# Patient Record
Sex: Male | Born: 1960 | Race: White | Hispanic: No | Marital: Married | Smoking: Former smoker
Health system: Southern US, Community
[De-identification: ages and names within clinical notes are randomized; demographics above are authoritative.]

## PROBLEM LIST (undated history)

## (undated) HISTORY — PX: ANAL FISSURE REPAIR: SHX2312

---

## 2002-09-07 ENCOUNTER — Emergency Department (HOSPITAL_COMMUNITY): Admission: EM | Admit: 2002-09-07 | Discharge: 2002-09-07 | Payer: Self-pay | Admitting: Emergency Medicine

## 2011-06-30 ENCOUNTER — Encounter (HOSPITAL_COMMUNITY): Payer: Self-pay | Admitting: Emergency Medicine

## 2011-06-30 ENCOUNTER — Emergency Department (HOSPITAL_COMMUNITY)
Admission: EM | Admit: 2011-06-30 | Discharge: 2011-06-30 | Disposition: A | Discharge status: Home / Self Care | Payer: BC Managed Care – PPO | Source: Home / Self Care | Attending: Emergency Medicine | Admitting: Emergency Medicine

## 2011-06-30 DIAGNOSIS — B349 Viral infection, unspecified: Secondary | ICD-10-CM

## 2011-06-30 DIAGNOSIS — B9789 Other viral agents as the cause of diseases classified elsewhere: Secondary | ICD-10-CM

## 2011-06-30 MED ORDER — ALBUTEROL SULFATE HFA 108 (90 BASE) MCG/ACT IN AERS: 1.0000 | INHALATION_SPRAY | Freq: Four times a day (QID) | RESPIRATORY_TRACT | Status: DC | PRN

## 2011-06-30 MED ORDER — HYDROCODONE-ACETAMINOPHEN 7.5-500 MG/15ML PO SOLN: 5.0000 mL | Freq: Four times a day (QID) | ORAL | Status: AC | PRN

## 2011-06-30 MED ORDER — IBUPROFEN 800 MG PO TABS: 800.0000 mg | ORAL_TABLET | Freq: Three times a day (TID) | ORAL | Status: AC | PRN

## 2011-06-30 NOTE — ED Provider Notes (Signed)
History     CSN: [id number]  Arrival date & time 06/30/11  1007   First MD Initiated Contact with Patient 06/30/11 1022      Chief Complaint  Patient presents with  . URI    (Consider location/radiation/quality/duration/timing/severity/associated sxs/prior treatment) HPI Comments: Patient with malaise, body aches, mild sore throat, nonproductive cough for 3 days. States he had a fever approximately 100-101 the first day, but that this has resolved. Patient states he feels better, but complains of persistent, nonproductive cough. No nasal congestion, rhinorrhea, nausea, vomiting, wheezing, chest pain, shortness of breath, abdominal pain. Daughter currently has pink eye and an upper respiratory syndrome. Has been taking NyQuil and DayQuil without relief.  ROS as noted in HPI. All other ROS negative.   Patient is a 51 y.o. male presenting with URI. The history is provided by the patient. No language interpreter was used.  URI The primary symptoms include cough. The current episode started 3 to 5 days ago. This is a new problem.  The onset of the illness is associated with exposure to sick contacts. Symptoms associated with the illness include chills. The illness is not associated with facial pain, sinus pressure, congestion or rhinorrhea.    History reviewed. No pertinent past medical history.  History reviewed. No pertinent past surgical history.  History reviewed. No pertinent family history.  History  Substance Use Topics  . Smoking status: Never Smoker   . Smokeless tobacco: Not on file  . Alcohol Use: Yes      Review of Systems  Constitutional: Positive for chills.  HENT: Negative for congestion, rhinorrhea and sinus pressure.   Respiratory: Positive for cough.     Allergies  Review of patient's allergies indicates no known allergies.  Home Medications   Current Outpatient Rx  Name Route Sig Dispense Refill  . GLUCOSAMINE-CHONDROITIN 500-400 MG PO TABS Oral Take  1 tablet by mouth 3 (three) times daily.    . MULTIVITAMINS PO TABS Oral Take 1 tablet by mouth daily.    . NYQUIL PO Oral Take by mouth.    . DAYQUIL PO Oral Take by mouth.    . ALBUTEROL SULFATE HFA 108 (90 BASE) MCG/ACT IN AERS Inhalation Inhale 1-2 puffs into the lungs every 6 (six) hours as needed for wheezing. Dispense with aerochamber 1 Inhaler 0  . HYDROCODONE-ACETAMINOPHEN 7.5-500 MG/15ML PO SOLN Oral Take 5 mLs by mouth every 6 (six) hours as needed for pain. 120 mL 0  . IBUPROFEN 800 MG PO TABS Oral Take 1 tablet (800 mg total) by mouth every 8 (eight) hours as needed for pain. 30 tablet 0    BP 132/82  Pulse 80  Temp(Src) 99 F (37.2 C) (Oral)  Resp 16  SpO2 100%  Physical Exam  Nursing note and vitals reviewed. Constitutional: He is oriented to person, place, and time. He appears well-developed and well-nourished.  HENT:  Head: Normocephalic and atraumatic.  Right Ear: Hearing, tympanic membrane and ear canal normal.  Left Ear: Hearing, tympanic membrane and ear canal normal.  Nose: Nose normal.  Mouth/Throat: Uvula is midline and mucous membranes are normal. Posterior oropharyngeal erythema present. No oropharyngeal exudate.       No sinus tenderness. Erythematous oropharynx with tender ulcers.  Eyes: Conjunctivae and EOM are normal. Pupils are equal, round, and reactive to light.  Neck: Normal range of motion. Neck supple.  Cardiovascular: Normal rate, regular rhythm and normal heart sounds.   Pulmonary/Chest: Effort normal and breath sounds normal. No respiratory distress.  Abdominal: Soft. Bowel sounds are normal. He exhibits no distension.  Musculoskeletal: Normal range of motion. He exhibits no edema and no tenderness.  Lymphadenopathy:    He has no cervical adenopathy.  Neurological: He is alert and oriented to person, place, and time.  Skin: Skin is warm and dry. No rash noted.  Psychiatric: He has a normal mood and affect. His behavior is normal. Judgment and  thought content normal.    ED Course  Procedures (including critical care time)  Labs Reviewed - No data to display No results found.   1. Viral syndrome       MDM  Lungs clear, no evidence of pneumonia at this time. H&P most consistent with coxsackie viral infection, with cough.  Luiz Blare, MD 06/30/11 [phone]

## 2011-06-30 NOTE — Discharge Instructions (Signed)
Take the medication as written. Take 1 gram of tylenol with the motrin up to 3 times a day as needed for pain and fever. This is an effective combination. Drink extra fluids. 2 puffs from the albuterol every 4-6 hours as needed for cough. Return if you get worse, have a persistent fever >100.4, or for any concerns.   Go to www.goodrx.com to look up your medications. This will give you a list of where you can find your prescriptions at the most affordable prices.

## 2011-06-30 NOTE — ED Notes (Signed)
Onset Thursday of symptoms.  Patient was vacationing at the beach, child has uri/pink eye.  Patient returned early from trip.  Reports fever, unknown degree, but reports generalized body aches.  Patient reports cough.  Denies sinus stuffiness or runny nose

## 2011-11-14 ENCOUNTER — Emergency Department (HOSPITAL_COMMUNITY)
Admission: EM | Admit: 2011-11-14 | Discharge: 2011-11-14 | Disposition: A | Discharge status: Home / Self Care | Payer: Worker's Compensation | Attending: Emergency Medicine | Admitting: Emergency Medicine

## 2011-11-14 ENCOUNTER — Encounter (HOSPITAL_COMMUNITY): Payer: Self-pay | Admitting: *Deleted

## 2011-11-14 ENCOUNTER — Emergency Department (HOSPITAL_COMMUNITY): Payer: Worker's Compensation

## 2011-11-14 DIAGNOSIS — S52502A Unspecified fracture of the lower end of left radius, initial encounter for closed fracture: Secondary | ICD-10-CM

## 2011-11-14 DIAGNOSIS — Z87891 Personal history of nicotine dependence: Secondary | ICD-10-CM | POA: Insufficient documentation

## 2011-11-14 DIAGNOSIS — W11XXXA Fall on and from ladder, initial encounter: Secondary | ICD-10-CM | POA: Insufficient documentation

## 2011-11-14 DIAGNOSIS — S52599A Other fractures of lower end of unspecified radius, initial encounter for closed fracture: Secondary | ICD-10-CM | POA: Insufficient documentation

## 2011-11-14 MED ORDER — IBUPROFEN 400 MG PO TABS
800.0000 mg | ORAL_TABLET | Freq: Once | ORAL | Status: AC
  Administered 2011-11-14: 800 mg via ORAL
  Filled 2011-11-14: qty 2

## 2011-11-14 NOTE — ED Provider Notes (Signed)
Medical screening examination/treatment/procedure(s) were performed by non-physician practitioner and as supervising physician I was immediately available for consultation/collaboration.  Doug Sou, MD 11/14/11 2027

## 2011-11-14 NOTE — Progress Notes (Signed)
Orthopedic Tech Progress Note Patient Details:  Seth Ellis Jun 13, 1960 [id number]  Ortho Devices Type of Ortho Device: Other (comment) (radial gutter) Ortho Device/Splint Location: left hand Seth Ellis Ortho Device/Splint Interventions: Application   Seth Ellis 11/14/2011, 4:54 PM

## 2011-11-14 NOTE — ED Notes (Signed)
Pt returned from X Ray.

## 2011-11-14 NOTE — ED Notes (Signed)
Pt fell off ladder 8-9 feet and land on foot and left hand.  Pt is here with left wrist pain.

## 2011-11-14 NOTE — ED Provider Notes (Signed)
History     CSN: [id number]  Arrival date & time 11/14/11  1358   First MD Initiated Contact with Patient 11/14/11 1529      Chief Complaint  Patient presents with  . Extremity Laceration    (Consider location/radiation/quality/duration/timing/severity/associated sxs/prior treatment) HPI Comments: 51 y/o male presents with left wrist pain s/p falling off a ladder at about 9 ft around 1pm today. States his foot slipped and he fell down, feet hit first, but then outstretched his left hand to catch himself from falling more and landed on his wrist. Pain worse with applying pressure on his hand. Denies hitting his head or LOC. Denies any lightheadedness or dizziness prior to fall. No ETOH usage. Denies any numbness or tingling in wrist/hand. No elbow, hand, or finger pain. No open wounds.   The history is provided by the patient.    History reviewed. No pertinent past medical history.  History reviewed. No pertinent past surgical history.  No family history on file.  History  Substance Use Topics  . Smoking status: Former Games developer  . Smokeless tobacco: Not on file  . Alcohol Use: Yes     occ      Review of Systems  Musculoskeletal:       Positive for left wrist pain  Skin: Negative for wound.  Neurological: Negative for dizziness, light-headedness and numbness.       No LOC    Allergies  Review of patient's allergies indicates no known allergies.  Home Medications   Current Outpatient Rx  Name Route Sig Dispense Refill  . GLUCOSAMINE-CHONDROITIN PO Oral Take 1 tablet by mouth daily.    . ADULT MULTIVITAMIN W/MINERALS CH Oral Take 1 tablet by mouth daily.      BP 136/79  Pulse 68  Temp 98.4 F (36.9 C) (Oral)  Resp 18  SpO2 94%  Physical Exam  Constitutional: He is oriented to person, place, and time. He appears well-developed and well-nourished. No distress.  HENT:  Head: Normocephalic and atraumatic.  Eyes: Conjunctivae and EOM are normal. Pupils are  equal, round, and reactive to light.  Neck: Neck supple.  Cardiovascular: Normal rate, regular rhythm, normal heart sounds and intact distal pulses.        Capillary refill <3 sec  Pulmonary/Chest: Effort normal and breath sounds normal.  Musculoskeletal:       Left elbow: Normal.       Left wrist: He exhibits decreased range of motion (most notably with flexion), tenderness (over distal radius. positive snuffbox tenderness), bony tenderness and swelling. He exhibits no deformity and no laceration.       Left forearm: Normal.       Left hand: Normal.  Neurological: He is alert and oriented to person, place, and time. No sensory deficit.  Skin: Skin is warm and dry. No abrasion and no laceration noted.  Psychiatric: He has a normal mood and affect. His speech is normal and behavior is normal.    ED Course  Procedures (including critical care time)  Labs Reviewed - No data to display Dg Wrist Complete Left  11/14/2011  *RADIOLOGY REPORT*  Clinical Data: Left wrist pain after a fall.  LEFT WRIST - COMPLETE 3+ VIEW  Comparison: None.  Findings: There is a nondisplaced fracture along the dorsal aspect of the distal radius.  Fracture line is seen along the articular surface of the radius as well.  Overlying soft tissue swelling.  IMPRESSION: Nondisplaced distal radius fracture.   Original Report Authenticated By:  Reyes Ivan, M.D.      1. Distal radius fracture, left       MDM  51 y/o male with non displaced left distal radius fracture. Patient neurovascularly intact. Splint applied. Instructions to call his orthopedist for appt in 2 days given. Advised ice, ibuprofen, and elevation.        Trevor Mace, PA-C 11/14/11 1609

## 2015-05-23 ENCOUNTER — Encounter: Payer: Self-pay | Admitting: Family Medicine

## 2016-04-18 ENCOUNTER — Emergency Department (HOSPITAL_COMMUNITY): Payer: 59

## 2016-04-18 ENCOUNTER — Emergency Department (HOSPITAL_COMMUNITY)
Admission: EM | Admit: 2016-04-18 | Discharge: 2016-04-18 | Disposition: A | Discharge status: Home / Self Care | Payer: 59 | Attending: Emergency Medicine | Admitting: Emergency Medicine

## 2016-04-18 ENCOUNTER — Encounter (HOSPITAL_COMMUNITY): Payer: Self-pay | Admitting: Emergency Medicine

## 2016-04-18 DIAGNOSIS — Z87891 Personal history of nicotine dependence: Secondary | ICD-10-CM | POA: Diagnosis not present

## 2016-04-18 DIAGNOSIS — N135 Crossing vessel and stricture of ureter without hydronephrosis: Secondary | ICD-10-CM | POA: Insufficient documentation

## 2016-04-18 DIAGNOSIS — N200 Calculus of kidney: Secondary | ICD-10-CM

## 2016-04-18 DIAGNOSIS — N201 Calculus of ureter: Secondary | ICD-10-CM | POA: Diagnosis not present

## 2016-04-18 DIAGNOSIS — R1032 Left lower quadrant pain: Secondary | ICD-10-CM | POA: Diagnosis present

## 2016-04-18 LAB — LIPASE, BLOOD: LIPASE: 31 U/L (ref 11–51)

## 2016-04-18 LAB — COMPREHENSIVE METABOLIC PANEL
ALK PHOS: 73 U/L (ref 38–126)
ALT: 22 U/L (ref 17–63)
ANION GAP: 7 (ref 5–15)
AST: 29 U/L (ref 15–41)
Albumin: 4.3 g/dL (ref 3.5–5.0)
BILIRUBIN TOTAL: 1.2 mg/dL (ref 0.3–1.2)
BUN: 33 mg/dL — ABNORMAL HIGH (ref 6–20)
CALCIUM: 8.9 mg/dL (ref 8.9–10.3)
CO2: 26 mmol/L (ref 22–32)
Chloride: 106 mmol/L (ref 101–111)
Creatinine, Ser: 1.23 mg/dL (ref 0.61–1.24)
Glucose, Bld: 147 mg/dL — ABNORMAL HIGH (ref 65–99)
Potassium: 3.8 mmol/L (ref 3.5–5.1)
SODIUM: 139 mmol/L (ref 135–145)
TOTAL PROTEIN: 7 g/dL (ref 6.5–8.1)

## 2016-04-18 LAB — URINALYSIS, ROUTINE W REFLEX MICROSCOPIC
Bacteria, UA: NONE SEEN
Bilirubin Urine: NEGATIVE
GLUCOSE, UA: NEGATIVE mg/dL
Ketones, ur: 5 mg/dL — AB
Leukocytes, UA: NEGATIVE
NITRITE: NEGATIVE
PH: 6 (ref 5.0–8.0)
PROTEIN: NEGATIVE mg/dL
SPECIFIC GRAVITY, URINE: 1.023 (ref 1.005–1.030)
Squamous Epithelial / LPF: NONE SEEN

## 2016-04-18 LAB — CBC WITH DIFFERENTIAL/PLATELET
Basophils Absolute: 0 10*3/uL (ref 0.0–0.1)
Basophils Relative: 0 %
EOS ABS: 0 10*3/uL (ref 0.0–0.7)
EOS PCT: 0 %
HCT: 41.5 % (ref 39.0–52.0)
Hemoglobin: 14.8 g/dL (ref 13.0–17.0)
LYMPHS ABS: 0.6 10*3/uL — AB (ref 0.7–4.0)
LYMPHS PCT: 4 %
MCH: 29.7 pg (ref 26.0–34.0)
MCHC: 35.7 g/dL (ref 30.0–36.0)
MCV: 83.2 fL (ref 78.0–100.0)
MONOS PCT: 6 %
Monocytes Absolute: 1.1 10*3/uL — ABNORMAL HIGH (ref 0.1–1.0)
Neutro Abs: 15.1 10*3/uL — ABNORMAL HIGH (ref 1.7–7.7)
Neutrophils Relative %: 90 %
Platelets: 282 10*3/uL (ref 150–400)
RBC: 4.99 MIL/uL (ref 4.22–5.81)
RDW: 12.4 % (ref 11.5–15.5)
WBC: 16.8 10*3/uL — ABNORMAL HIGH (ref 4.0–10.5)

## 2016-04-18 MED ORDER — OXYCODONE-ACETAMINOPHEN 5-325 MG PO TABS: 2.0000 | ORAL_TABLET | ORAL | 0 refills | Status: DC | PRN

## 2016-04-18 MED ORDER — SODIUM CHLORIDE 0.9 % IV BOLUS (SEPSIS)
1000.0000 mL | Freq: Once | INTRAVENOUS | Status: AC
  Administered 2016-04-18: 1000 mL via INTRAVENOUS

## 2016-04-18 MED ORDER — KETOROLAC TROMETHAMINE 30 MG/ML IJ SOLN
30.0000 mg | Freq: Once | INTRAMUSCULAR | Status: AC
  Administered 2016-04-18: 30 mg via INTRAVENOUS
  Filled 2016-04-18: qty 1

## 2016-04-18 MED ORDER — HYDROMORPHONE HCL 1 MG/ML IJ SOLN
1.0000 mg | Freq: Once | INTRAMUSCULAR | Status: AC
  Administered 2016-04-18: 1 mg via INTRAVENOUS
  Filled 2016-04-18: qty 1

## 2016-04-18 MED ORDER — ONDANSETRON 4 MG PO TBDP: 4.0000 mg | ORAL_TABLET | Freq: Three times a day (TID) | ORAL | 0 refills | Status: DC | PRN

## 2016-04-18 MED ORDER — ONDANSETRON HCL 4 MG/2ML IJ SOLN
4.0000 mg | Freq: Once | INTRAMUSCULAR | Status: AC
  Administered 2016-04-18: 4 mg via INTRAVENOUS
  Filled 2016-04-18: qty 2

## 2016-04-18 MED ORDER — MORPHINE SULFATE (PF) 4 MG/ML IV SOLN
4.0000 mg | Freq: Once | INTRAVENOUS | Status: AC
  Administered 2016-04-18: 4 mg via INTRAVENOUS
  Filled 2016-04-18: qty 1

## 2016-04-18 NOTE — ED Provider Notes (Signed)
[facility] DEPT Provider Note   CSN: [id number] Arrival date & time: 04/18/16  [id number]     History   Chief Complaint Chief Complaint  Patient presents with  . Abdominal Pain    HPI Seth Ellis is a 56 y.o. male.  HPI  This is a 56 year old male with no significant past medical history who presents with left lower abdominal pain. Onset of symptoms early this morning. Patient reports that on Sunday he noted a brown discoloration to his urine. Since that time he has had one self-limited episode of left flank and lower abdominal pain that was self resolved. However, tonight he had recurrence of symptoms. It is sharp. It is nonradiating. It is constant. Current pain is 6 out of 10. He has not taken anything for his symptoms. Denies any dysuria. Denies any fevers, chest pain, shortness of breath. No history of kidney stones.  History reviewed. No pertinent past medical history.  There are no active problems to display for this patient.   History reviewed. No pertinent surgical history.     Home Medications    Prior to Admission medications   Medication Sig Start Date End Date Taking? Authorizing Provider  ibuprofen (ADVIL,MOTRIN) 200 MG tablet Take 400 mg by mouth every 6 (six) hours as needed for moderate pain.   Yes Historical Provider, MD  ondansetron (ZOFRAN ODT) 4 MG disintegrating tablet Take 1 tablet (4 mg total) by mouth every 8 (eight) hours as needed for nausea or vomiting. 04/18/16   Merryl Hacker, MD  oxyCODONE-acetaminophen (PERCOCET/ROXICET) 5-325 MG tablet Take 2 tablets by mouth every 4 (four) hours as needed for severe pain. 04/18/16   Merryl Hacker, MD    Family History History reviewed. No pertinent family history.  Social History Social History  Substance Use Topics  . Smoking status: Former Research scientist (life sciences)  . Smokeless tobacco: Never Used  . Alcohol use Yes     Comment: occ     Allergies   Patient has no known allergies.   Review of  Systems Review of Systems  Constitutional: Negative for fever.  Respiratory: Negative for shortness of breath.   Cardiovascular: Negative for chest pain.  Gastrointestinal: Positive for abdominal pain, nausea and vomiting. Negative for diarrhea.  Genitourinary: Positive for hematuria. Negative for difficulty urinating and dysuria.  All other systems reviewed and are negative.    Physical Exam Updated Vital Signs BP 135/76 (BP Location: Right Arm)   Pulse 86   Temp 97.6 F (36.4 C) (Oral)   Resp 18   Ht 6' (1.829 m)   Wt 180 lb (81.6 kg)   SpO2 92%   BMI 24.41 kg/m   Physical Exam  Constitutional: He is oriented to person, place, and time. He appears well-developed and well-nourished.  Uncomfortable appearing, no acute distress  HENT:  Head: Normocephalic and atraumatic.  Cardiovascular: Normal rate, regular rhythm and normal heart sounds.   No murmur heard. Pulmonary/Chest: Effort normal and breath sounds normal. No respiratory distress. He has no wheezes.  Abdominal: Soft. Bowel sounds are normal. There is no tenderness. There is no rebound.  Genitourinary:  Genitourinary Comments: No CVA tenderness  Musculoskeletal: He exhibits no edema.  Neurological: He is alert and oriented to person, place, and time.  Skin: Skin is warm and dry.  Psychiatric: He has a normal mood and affect.  Nursing note and vitals reviewed.    ED Treatments / Results  Labs (all labs ordered are listed, but only abnormal results are displayed)  Labs Reviewed  CBC WITH DIFFERENTIAL/PLATELET - Abnormal; Notable for the following:       Result Value   WBC 16.8 (*)    Neutro Abs 15.1 (*)    Lymphs Abs 0.6 (*)    Monocytes Absolute 1.1 (*)    All other components within normal limits  COMPREHENSIVE METABOLIC PANEL - Abnormal; Notable for the following:    Glucose, Bld 147 (*)    BUN 33 (*)    All other components within normal limits  URINALYSIS, ROUTINE W REFLEX MICROSCOPIC - Abnormal;  Notable for the following:    Hgb urine dipstick MODERATE (*)    Ketones, ur 5 (*)    All other components within normal limits  LIPASE, BLOOD    EKG  EKG Interpretation None       Radiology Ct Renal Stone Study  Result Date: 04/18/2016 CLINICAL DATA:  Initial evaluation for acute left flank pain. EXAM: CT ABDOMEN AND PELVIS WITHOUT CONTRAST TECHNIQUE: Multidetector CT imaging of the abdomen and pelvis was performed following the standard protocol without IV contrast. COMPARISON:  None available. FINDINGS: Lower chest: Deep tendon atelectasis present within the lung bases. Visualized lungs are otherwise clear. Hepatobiliary: Subcentimeter hypodensity with left hepatic lobe noted, indeterminate. Liver otherwise unremarkable. Gallbladder normal. No biliary dilatation. Pancreas: Pancreas within normal limits. Spleen: Spleen within normal limits. Adrenals/Urinary Tract: Adrenal glands are normal. Right kidney unremarkable without evidence for nephrolithiasis or hydronephrosis. No radiopaque calculi seen along the course of the right renal collecting system. No right-sided hydroureter. On the left, there is an obstructive 3 mm stone located at the right UPJ with secondary moderate left hydronephrosis. Left-sided perinephric fat stranding, extending inferiorly along the left psoas muscle. Left ureter is decompressed distally with no other calculi identified. 3.1 cm cyst with a few peripheral calcifications noted within the lower pole left kidney. Bladder largely decompressed without acute abnormality. Stomach/Bowel: Stomach within normal limits. No evidence for bowel obstruction. Appendix normal. Sigmoid diverticulosis without evidence for acute diverticulitis. No acute inflammatory changes about the bowels. Vascular/Lymphatic: Intra-abdominal aorta of normal caliber. No adenopathy. Reproductive: Prostate normal. Other: No free air or fluid. Small bilateral fat containing inguinal hernias noted.  Musculoskeletal: No acute osseous abnormality. No worrisome lytic or blastic osseous lesions. IMPRESSION: 1. 3 mm obstructive stone at the left UPJ with secondary moderate left hydronephrosis. 2. No other acute intra-abdominal or pelvic process. 3. Colonic diverticulosis without evidence for acute diverticulitis. Electronically Signed   By: Jeannine Boga M.D.   On: 04/18/2016 06:19    Procedures Procedures (including critical care time)  Medications Ordered in ED Medications  HYDROmorphone (DILAUDID) injection 1 mg (not administered)  ketorolac (TORADOL) 30 MG/ML injection 30 mg (not administered)  sodium chloride 0.9 % bolus 1,000 mL (1,000 mLs Intravenous New Bag/Given 04/18/16 0559)  morphine 4 MG/ML injection 4 mg (4 mg Intravenous Given 04/18/16 0554)  ondansetron (ZOFRAN) injection 4 mg (4 mg Intravenous Given 04/18/16 0554)     Initial Impression / Assessment and Plan / ED Course  I have reviewed the triage vital signs and the nursing notes.  Pertinent labs & imaging results that were available during my care of the patient were reviewed by me and considered in my medical decision making (see chart for details).     Patient presents with right lower quadrant pain and dark urine. History most suggestive of kidney stones. No history of same. Lab work obtained largely reassuring. Hematuria noted. CT scan shows a 3 mm stone.  6:55 AM On recheck, patient reports he had transient improvement of pain with pain medications in the colon however, pain has returned. He was redosed Toradol and Dilaudid.  Final Clinical Impressions(s) / ED Diagnoses   Final diagnoses:  Kidney stones    New Prescriptions New Prescriptions   ONDANSETRON (ZOFRAN ODT) 4 MG DISINTEGRATING TABLET    Take 1 tablet (4 mg total) by mouth every 8 (eight) hours as needed for nausea or vomiting.   OXYCODONE-ACETAMINOPHEN (PERCOCET/ROXICET) 5-325 MG TABLET    Take 2 tablets by mouth every 4 (four) hours as  needed for severe pain.     Merryl Hacker, MD 04/18/16 [phone]

## 2016-04-18 NOTE — ED Notes (Signed)
Bed: [id number] Expected date:  Expected time:  Means of arrival:  Comments: 56 yr old, abdominal pain

## 2016-04-18 NOTE — Discharge Instructions (Signed)
I understand that if any problems occur once I am at home I am to contact my physician.  I understand and acknowledge receipt of the instructions indicated above.    _____________________________________________                                                       Physician's or R.N.'s Signature                Date/Time                        _____________________________________________                                                       Patient or Representative Signature         Date/Time         

## 2016-04-18 NOTE — ED Notes (Signed)
EMS gave 100 mcg of fentanyl in route

## 2016-04-18 NOTE — ED Provider Notes (Signed)
8:16 AM Pt reassessed. Now feeling much better after getting toradol/dilaudid. Reviewed CT and lab results with him again and also his wife.  Discussed return precautions and restrictions while taking narcotic pain meds. Urology FU information provided with DC instructions.    Virgel Manifold, MD 04/18/16 [phone]

## 2016-04-18 NOTE — ED Triage Notes (Signed)
Pt complains of 10/10 flank pain, no hematuria, no kidney stone hx, nausea and vomiting. Pt states the pain started at midnight

## 2016-04-18 NOTE — ED Triage Notes (Signed)
Pt states pain to abd awoke him from sleep at @ midnight. (+) N/V denies diarrhea

## 2017-12-13 IMAGING — CT CT RENAL STONE PROTOCOL
2 of 3 series · 15 of 46 positions shown, 17 images · non-contrast
Comparison: None available.

CLINICAL DATA: Initial evaluation for acute left flank pain.

EXAM:
CT ABDOMEN AND PELVIS WITHOUT CONTRAST
TECHNIQUE: Multidetector CT imaging of the abdomen and pelvis was performed
following the standard protocol without IV contrast.

[Series 3: lung · axial · 0.72mm/px · z∈[-208,-100]mm · 12 of 64 slices shown, 14 images]
[im 5/64  soft-tissue]
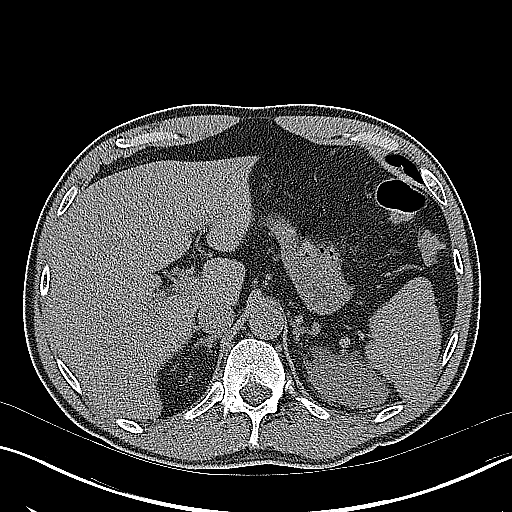
[im 5/64  bone]
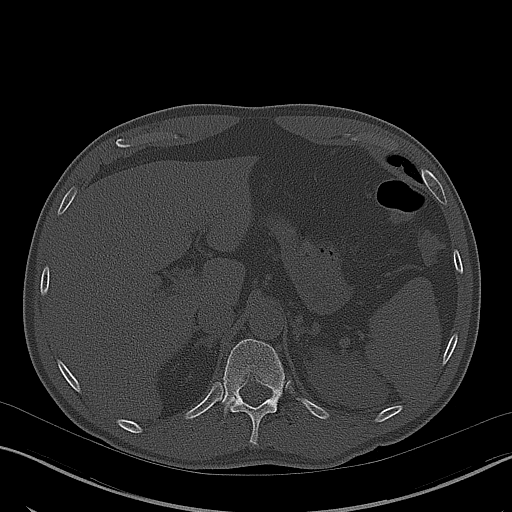
[im 9/64  soft-tissue]
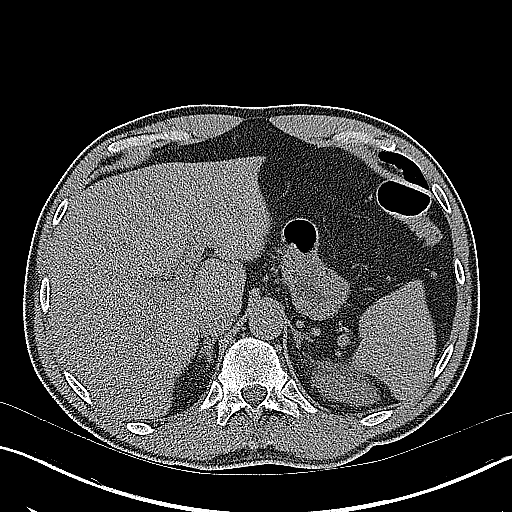
[im 15/64  soft-tissue]
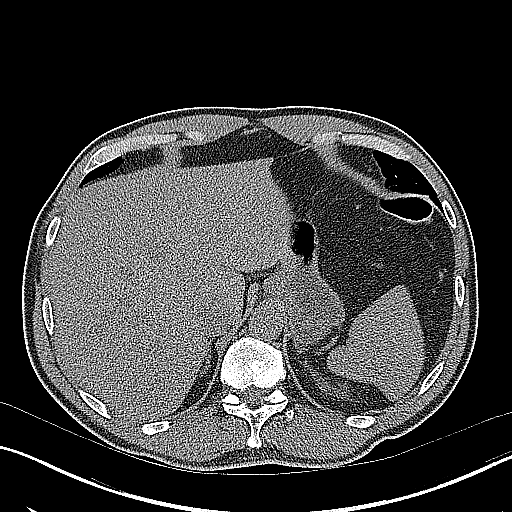
[im 19/64  soft-tissue]
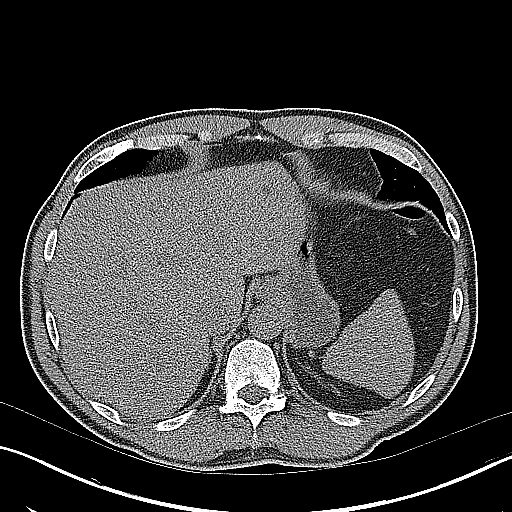
[im 25/64  soft-tissue]
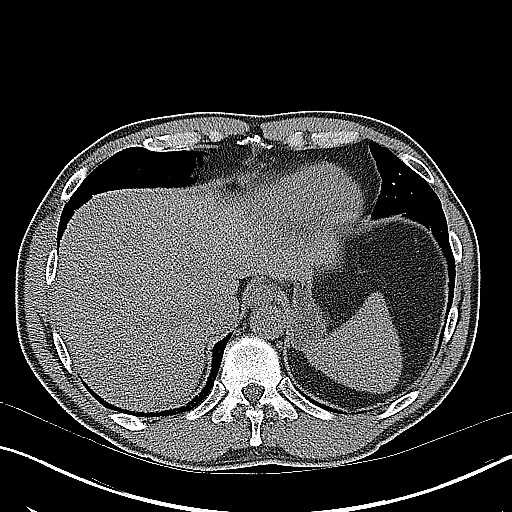
[im 29/64  soft-tissue]
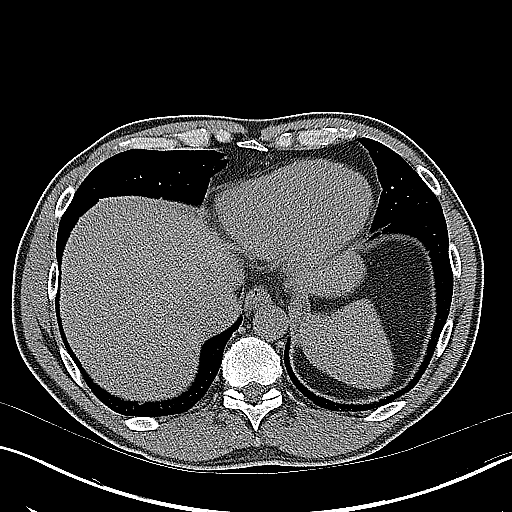
[im 35/64  soft-tissue]
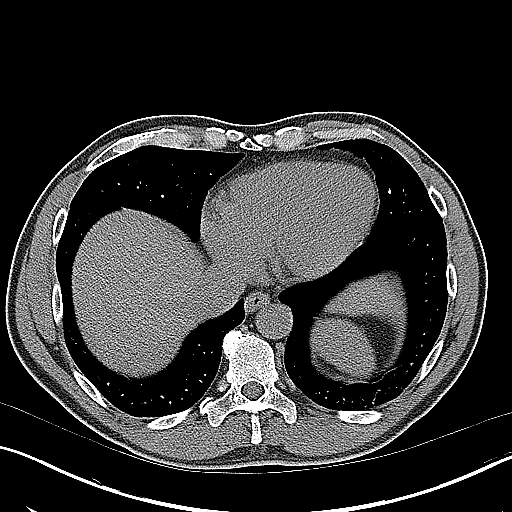
[im 39/64  soft-tissue]
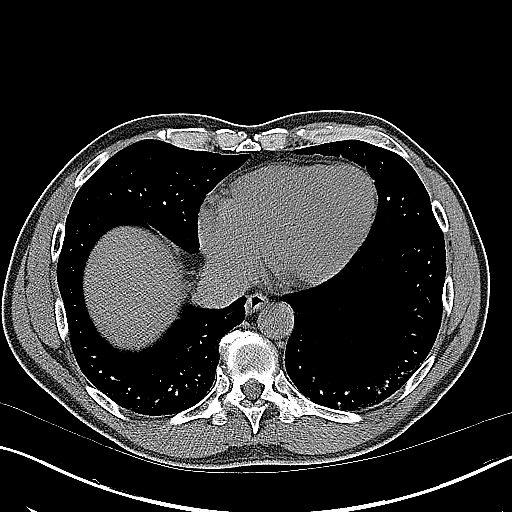
[im 45/64  soft-tissue]
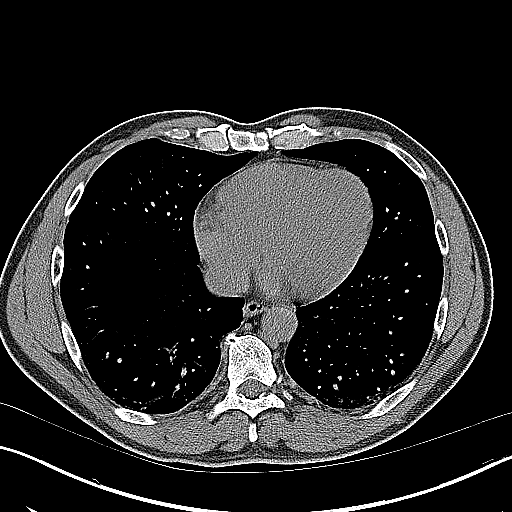
[im 45/64  bone]
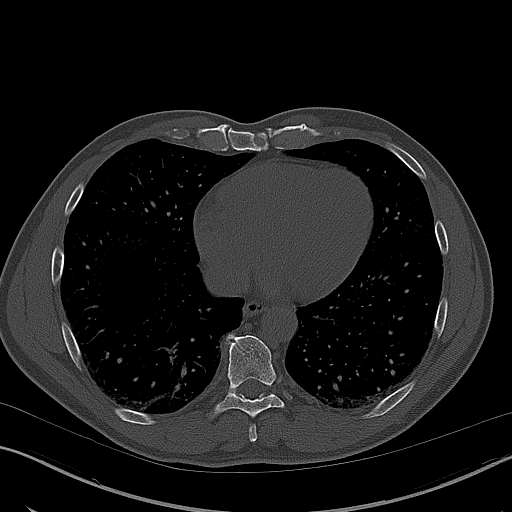
[im 49/64  soft-tissue]
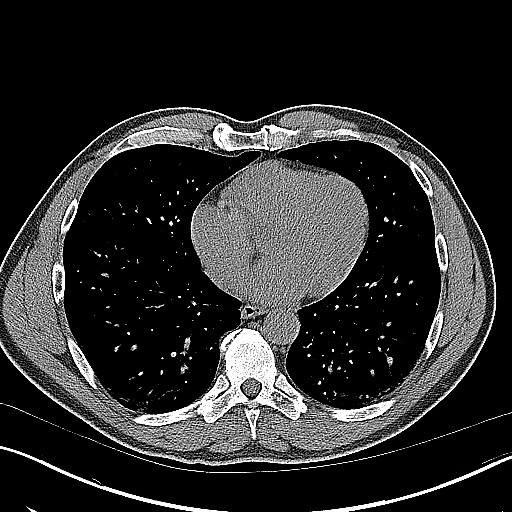
[im 55/64  soft-tissue]
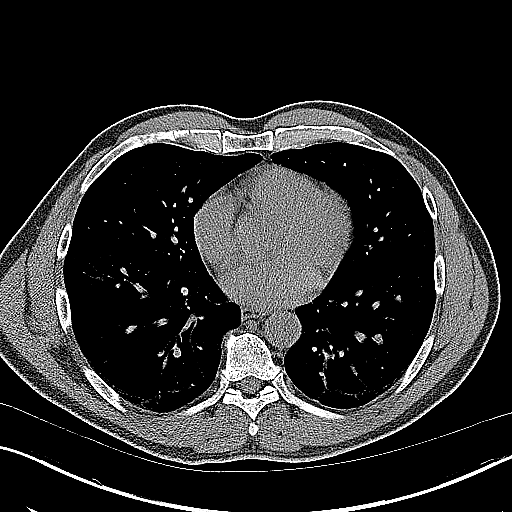
[im 59/64  soft-tissue]
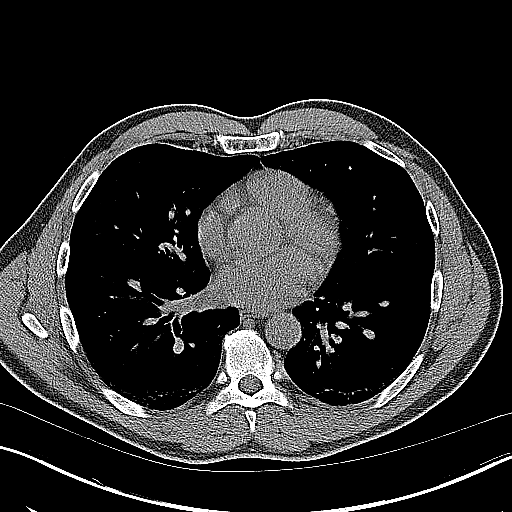

[Series 4: coronal · coronal · 0.64mm/px · 3 of 127 slices shown]
[im 43/127  soft-tissue]
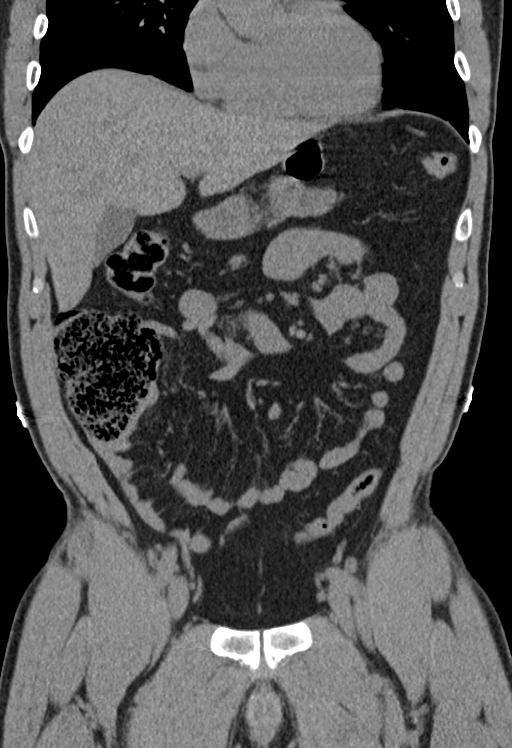
[im 57/127  soft-tissue]
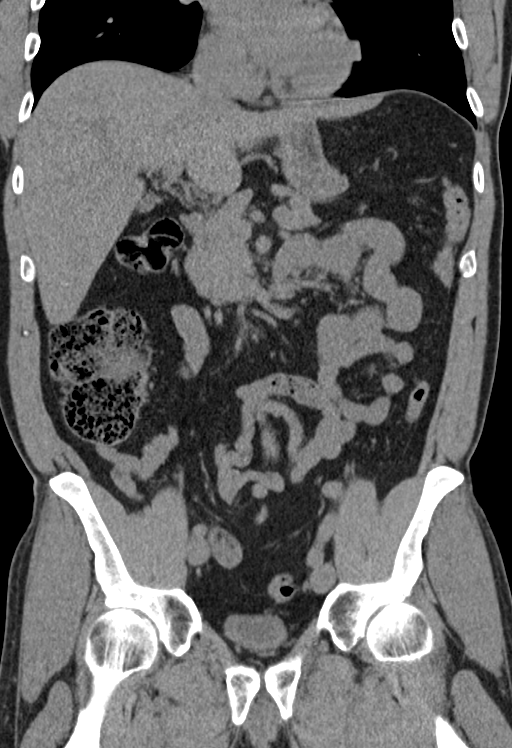
[im 71/127  soft-tissue]
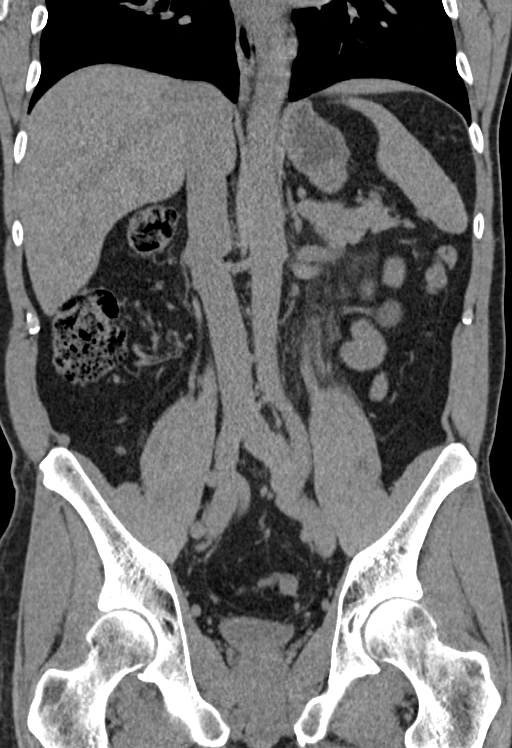

[15 of 46 positions shown; findings below may reference images not displayed]

FINDINGS: Lower chest: Deep tendon atelectasis present within the lung bases.
Visualized lungs are otherwise clear.

Hepatobiliary: Subcentimeter hypodensity with left hepatic lobe
noted, indeterminate. Liver otherwise unremarkable. Gallbladder
normal. No biliary dilatation.

Pancreas: Pancreas within normal limits.

Spleen: Spleen within normal limits.

Adrenals/Urinary Tract: Adrenal glands are normal.

Right kidney unremarkable without evidence for nephrolithiasis or
hydronephrosis. No radiopaque calculi seen along the course of the
right renal collecting system. No right-sided hydroureter.

On the left, there is an obstructive 3 mm stone located at the right
UPJ with secondary moderate left hydronephrosis. Left-sided
perinephric fat stranding, extending inferiorly along the left psoas
muscle. Left ureter is decompressed distally with no other calculi
identified. 3.1 cm cyst with a few peripheral calcifications noted
within the lower pole left kidney.

Bladder largely decompressed without acute abnormality.

Stomach/Bowel: Stomach within normal limits. No evidence for bowel
obstruction. Appendix normal. Sigmoid diverticulosis without
evidence for acute diverticulitis. No acute inflammatory changes
about the bowels.

Vascular/Lymphatic: Intra-abdominal aorta of normal caliber. No
adenopathy.

Reproductive: Prostate normal.

Other: No free air or fluid. Small bilateral fat containing inguinal
hernias noted.

Musculoskeletal: No acute osseous abnormality. No worrisome lytic or
blastic osseous lesions.
IMPRESSION: 1. 3 mm obstructive stone at the left UPJ with secondary moderate
left hydronephrosis.
2. No other acute intra-abdominal or pelvic process.
3. Colonic diverticulosis without evidence for acute diverticulitis.

## 2019-07-02 ENCOUNTER — Ambulatory Visit: Payer: Self-pay | Attending: Internal Medicine

## 2019-07-02 DIAGNOSIS — Z23 Encounter for immunization: Secondary | ICD-10-CM

## 2019-07-02 NOTE — Progress Notes (Signed)
   Covid-19 Vaccination Clinic  Name:  Seth Ellis    MRN: [id number] DOB: [birth year]  07/02/2019  Seth Ellis was observed post Covid-19 immunization for 15 minutes without incident. He was provided with Vaccine Information Sheet and instruction to access the V-Safe system.   Seth Ellis was instructed to call 911 with any severe reactions post vaccine: Marland Kitchen Difficulty breathing  . Swelling of face and throat  . A fast heartbeat  . A bad rash all over body  . Dizziness and weakness   Immunizations Administered    Name Date Dose VIS Date Route   Pfizer COVID-19 Vaccine 07/02/2019  3:27 PM 0.3 mL 03/06/2019 Intramuscular   Manufacturer: [employer]   Lot: [id number]   [place]: [id number]

## 2019-07-27 ENCOUNTER — Ambulatory Visit: Payer: Self-pay | Attending: Internal Medicine

## 2019-07-27 DIAGNOSIS — Z23 Encounter for immunization: Secondary | ICD-10-CM

## 2019-07-27 NOTE — Progress Notes (Signed)
   Covid-19 Vaccination Clinic  Name:  Seth Ellis    MRN: [id number] DOB: 1960-10-10  07/27/2019  Seth Ellis was observed post Covid-19 immunization for 15 minutes without incident. He was provided with Vaccine Information Sheet and instruction to access the V-Safe system.   Seth Ellis was instructed to call 911 with any severe reactions post vaccine: Marland Kitchen Difficulty breathing  . Swelling of face and throat  . A fast heartbeat  . A bad rash all over body  . Dizziness and weakness   Immunizations Administered    Name Date Dose VIS Date Route   Pfizer COVID-19 Vaccine 07/27/2019 11:45 AM 0.3 mL 05/20/2018 Intramuscular   Manufacturer: [employer]   Lot: [id number]   [place]: [id number]

## 2020-03-14 ENCOUNTER — Encounter: Payer: Self-pay | Admitting: Gastroenterology

## 2021-12-19 ENCOUNTER — Ambulatory Visit (AMBULATORY_SURGERY_CENTER): Payer: Self-pay

## 2021-12-19 VITALS — Ht 71.0 in | Wt 179.2 lb

## 2021-12-19 DIAGNOSIS — Z1211 Encounter for screening for malignant neoplasm of colon: Secondary | ICD-10-CM

## 2021-12-19 MED ORDER — NA SULFATE-K SULFATE-MG SULF 17.5-3.13-1.6 GM/177ML PO SOLN: 1.0000 | Freq: Once | ORAL | 0 refills | Status: AC

## 2021-12-19 NOTE — Progress Notes (Signed)
No egg or soy allergy known to patient  No issues known to pt with past sedation with any surgeries or procedures Patient denies ever being told they had issues or difficulty with intubation  No FH of Malignant Hyperthermia Pt is not on diet pills Pt is not on  home 02  Pt is not on blood thinners  Pt denies issues with constipation  No A fib or A flutter Have any cardiac testing pending--no Pt instructed to use Singlecare.com or GoodRx for a price reduction on prep   

## 2021-12-20 ENCOUNTER — Encounter: Payer: Self-pay | Admitting: Gastroenterology

## 2022-01-03 ENCOUNTER — Encounter: Payer: Self-pay | Admitting: Gastroenterology

## 2022-01-03 ENCOUNTER — Ambulatory Visit (AMBULATORY_SURGERY_CENTER): Payer: BC Managed Care – PPO | Admitting: Gastroenterology

## 2022-01-03 VITALS — BP 133/91 | HR 80 | Temp 98.1°F | Resp 18 | Ht 71.0 in | Wt 179.0 lb

## 2022-01-03 DIAGNOSIS — D122 Benign neoplasm of ascending colon: Secondary | ICD-10-CM

## 2022-01-03 DIAGNOSIS — K635 Polyp of colon: Secondary | ICD-10-CM

## 2022-01-03 DIAGNOSIS — D125 Benign neoplasm of sigmoid colon: Secondary | ICD-10-CM

## 2022-01-03 DIAGNOSIS — Z1211 Encounter for screening for malignant neoplasm of colon: Secondary | ICD-10-CM

## 2022-01-03 DIAGNOSIS — K6389 Other specified diseases of intestine: Secondary | ICD-10-CM | POA: Diagnosis not present

## 2022-01-03 MED ORDER — SODIUM CHLORIDE 0.9 % IV SOLN: 500.0000 mL | Freq: Once | INTRAVENOUS | Status: DC

## 2022-01-03 NOTE — Progress Notes (Signed)
GASTROENTEROLOGY PROCEDURE H&P NOTE   Primary Care Physician: Pcp, No  HPI: Seth Ellis is a 61 y.o. male who presents for Colon Cancer screening.  History reviewed. No pertinent past medical history. Past Surgical History:  Procedure Laterality Date   ANAL FISSURE REPAIR     Current Outpatient Medications  Medication Sig Dispense Refill   famotidine (PEPCID) 10 MG tablet Take 10 mg by mouth 2 (two) times daily.     MAGNESIUM-POTASSIUM PO Take by mouth.     Multiple Vitamin (MULTIVITAMIN) tablet Take 1 tablet by mouth daily.     naproxen sodium (ALEVE) 220 MG tablet Take 220 mg by mouth daily as needed.     Current Facility-Administered Medications  Medication Dose Route Frequency Provider Last Rate Last Admin   0.9 %  sodium chloride infusion  500 mL Intravenous Once Mansouraty, Telford Nab., MD        Current Outpatient Medications:    famotidine (PEPCID) 10 MG tablet, Take 10 mg by mouth 2 (two) times daily., Disp: , Rfl:    MAGNESIUM-POTASSIUM PO, Take by mouth., Disp: , Rfl:    Multiple Vitamin (MULTIVITAMIN) tablet, Take 1 tablet by mouth daily., Disp: , Rfl:    naproxen sodium (ALEVE) 220 MG tablet, Take 220 mg by mouth daily as needed., Disp: , Rfl:   Current Facility-Administered Medications:    0.9 %  sodium chloride infusion, 500 mL, Intravenous, Once, Mansouraty, Telford Nab., MD No Known Allergies Family History  Problem Relation Age of Onset   Colon polyps Father    Colon cancer Neg Hx    Esophageal cancer Neg Hx    Rectal cancer Neg Hx    Stomach cancer Neg Hx    Social History   Socioeconomic History   Marital status: Married    Spouse name: Not on file   Number of children: Not on file   Years of education: Not on file   Highest education level: Not on file  Occupational History   Not on file  Tobacco Use   Smoking status: Former   Smokeless tobacco: Never  Vaping Use   Vaping Use: Never used  Substance and Sexual Activity   Alcohol use:  Yes    Comment: occ   Drug use: No   Sexual activity: Not on file  Other Topics Concern   Not on file  Social History Narrative   Not on file   Social Determinants of Health   Financial Resource Strain: Not on file  Food Insecurity: Not on file  Transportation Needs: Not on file  Physical Activity: Not on file  Stress: Not on file  Social Connections: Not on file  Intimate Partner Violence: Not on file    Physical Exam: Today's Vitals   01/03/22 1335  BP: (!) 141/64  Pulse: 91  Temp: 98.1 F (36.7 C)  TempSrc: Temporal  SpO2: 96%  Weight: 179 lb (81.2 kg)  Height: '5\' 11"'$  (1.803 m)   Body mass index is 24.97 kg/m. GEN: NAD EYE: Sclerae anicteric ENT: MMM CV: Non-tachycardic GI: Soft, NT/ND NEURO:  Alert & Oriented x 3  Lab Results: No results for input(s): "WBC", "HGB", "HCT", "PLT" in the last 72 hours. BMET No results for input(s): "NA", "K", "CL", "CO2", "GLUCOSE", "BUN", "CREATININE", "CALCIUM" in the last 72 hours. LFT No results for input(s): "PROT", "ALBUMIN", "AST", "ALT", "ALKPHOS", "BILITOT", "BILIDIR", "IBILI" in the last 72 hours. PT/INR No results for input(s): "LABPROT", "INR" in the last 72 hours.  Impression / Plan: This is a 61 y.o.male who presents for Colon Cancer screening.  The risks and benefits of endoscopic evaluation/treatment were discussed with the patient and/or family; these include but are not limited to the risk of perforation, infection, bleeding, missed lesions, lack of diagnosis, severe illness requiring hospitalization, as well as anesthesia and sedation related illnesses.  The patient's history has been reviewed, patient examined, no change in status, and deemed stable for procedure.  The patient and/or family is agreeable to proceed.    Justice Britain, MD [facility] Office # [phone]

## 2022-01-03 NOTE — Patient Instructions (Signed)
Discharge instructions given. Handouts on polyps,diverticulosis and hemorrhoids. Resume previous medications. YOU HAD AN ENDOSCOPIC PROCEDURE TODAY AT THE [facility]:   Refer to the procedure report that was given to you for any specific questions about what was found during the examination.  If the procedure report does not answer your questions, please call your gastroenterologist to clarify.  If you requested that your care partner not be given the details of your procedure findings, then the procedure report has been included in a sealed envelope for you to review at your convenience later.  YOU SHOULD EXPECT: Some feelings of bloating in the abdomen. Passage of more gas than usual.  Walking can help get rid of the air that was put into your GI tract during the procedure and reduce the bloating. If you had a lower endoscopy (such as a colonoscopy or flexible sigmoidoscopy) you may notice spotting of blood in your stool or on the toilet paper. If you underwent a bowel prep for your procedure, you may not have a normal bowel movement for a few days.  Please Note:  You might notice some irritation and congestion in your nose or some drainage.  This is from the oxygen used during your procedure.  There is no need for concern and it should clear up in a day or so.  SYMPTOMS TO REPORT IMMEDIATELY:  Following lower endoscopy (colonoscopy or flexible sigmoidoscopy):  Excessive amounts of blood in the stool  Significant tenderness or worsening of abdominal pains  Swelling of the abdomen that is new, acute  Fever of 100F or higher   For urgent or emergent issues, a gastroenterologist can be reached at any hour by calling [phone]. Do not use MyChart messaging for urgent concerns.    DIET:  We do recommend a small meal at first, but then you may proceed to your regular diet.  Drink plenty of fluids but you should avoid alcoholic beverages for 24 hours.  ACTIVITY:  You should  plan to take it easy for the rest of today and you should NOT DRIVE or use heavy machinery until tomorrow (because of the sedation medicines used during the test).    FOLLOW UP: Our staff will call the number listed on your records the next business day following your procedure.  We will call around 7:15- 8:00 am to check on you and address any questions or concerns that you may have regarding the information given to you following your procedure. If we do not reach you, we will leave a message.     If any biopsies were taken you will be contacted by phone or by letter within the next 1-3 weeks.  Please call us at [phone] if you have not heard about the biopsies in 3 weeks.    SIGNATURES/CONFIDENTIALITY: You and/or your care partner have signed paperwork which will be entered into your electronic medical record.  These signatures attest to the fact that that the information above on your After Visit Summary has been reviewed and is understood.  Full responsibility of the confidentiality of this discharge information lies with you and/or your care-partner. 

## 2022-01-03 NOTE — Progress Notes (Signed)
Called to room to assist during endoscopic procedure.  Patient ID and intended procedure confirmed with present staff. Received instructions for my participation in the procedure from the performing physician.  

## 2022-01-03 NOTE — Progress Notes (Signed)
A and O x3. Report to RN. Tolerated MAC anesthesia well. 

## 2022-01-03 NOTE — Progress Notes (Signed)
Pt's states no medical or surgical changes since previsit or office visit. 

## 2022-01-03 NOTE — Op Note (Signed)
[facility] Patient Name: Seth Ellis Procedure Date: 01/03/2022 2:17 PM MRN: [id number] Endoscopist: Justice Britain , MD Age: 61 Referring MD:  Date of Birth: 01-08-1961 Gender: Male Account #: [id number] Procedure:                Colonoscopy Indications:              Screening for colorectal malignant neoplasm, This                            is the patient's first colonoscopy Medicines:                Monitored Anesthesia Care Procedure:                Pre-Anesthesia Assessment:                           - Prior to the procedure, a History and Physical                            was performed, and patient medications and                            allergies were reviewed. The patient's tolerance of                            previous anesthesia was also reviewed. The risks                            and benefits of the procedure and the sedation                            options and risks were discussed with the patient.                            All questions were answered, and informed consent                            was obtained. Prior Anticoagulants: The patient has                            taken no previous anticoagulant or antiplatelet                            agents. ASA Grade Assessment: II - A patient with                            mild systemic disease. After reviewing the risks                            and benefits, the patient was deemed in                            satisfactory condition to undergo the procedure.  After obtaining informed consent, the colonoscope                            was passed under direct vision. Throughout the                            procedure, the patient's blood pressure, pulse, and                            oxygen saturations were monitored continuously. The                            CF [id number] #[id number] was introduced through the anus                            and advanced to the the  cecum, identified by                            appendiceal orifice and ileocecal valve. The                            colonoscopy was performed without difficulty. The                            patient tolerated the procedure. The quality of the                            bowel preparation was adequate. The ileocecal                            valve, appendiceal orifice, and rectum were                            photographed. Scope In: 2:34:06 PM Scope Out: 2:47:44 PM Scope Withdrawal Time: 0 hours 10 minutes 20 seconds  Total Procedure Duration: 0 hours 13 minutes 38 seconds  Findings:                 The digital rectal exam findings include                            hemorrhoids. Pertinent negatives include no                            palpable rectal lesions.                           The colon (entire examined portion) revealed                            moderately excessive looping.                           Two sessile polyps were found in the sigmoid colon  and ascending colon. The polyps were 2 to 3 mm in                            size. These polyps were removed with a cold snare.                            Resection and retrieval were complete.                           Normal mucosa was found in the entire colon                            otherwise.                           Non-bleeding non-thrombosed external and internal                            hemorrhoids were found during retroflexion, during                            perianal exam and during digital exam. The                            hemorrhoids were Grade III (internal hemorrhoids                            that prolapse but require manual reduction). Complications:            No immediate complications. Estimated Blood Loss:     Estimated blood loss was minimal. Impression:               - Hemorrhoids found on digital rectal exam.                           - There was a moderate  amount of looping of the                            colon.                           - Two 2 to 3 mm polyps in the sigmoid colon and in                            the ascending colon, removed with a cold snare.                            Resected and retrieved.                           - Normal mucosa in the entire examined colon.                           - Non-bleeding non-thrombosed external and internal  hemorrhoids. Recommendation:           - The patient will be observed post-procedure,                            until all discharge criteria are met.                           - Discharge patient to home.                           - Patient has a contact number available for                            emergencies. The signs and symptoms of potential                            delayed complications were discussed with the                            patient. Return to normal activities tomorrow.                            Written discharge instructions were provided to the                            patient.                           - High fiber diet.                           - Use FiberCon 1-2 tablets PO daily.                           - Continue present medications.                           - Await pathology results.                           - Repeat colonoscopy in 5-10 years for surveillance                            based on pathology results and findings of                            adenomatous tissue.                           - The findings and recommendations were discussed                            with the patient.                           - The findings and recommendations were discussed  with the patient's family. Justice Britain, MD 01/03/2022 2:52:33 PM

## 2022-01-04 ENCOUNTER — Telehealth: Payer: Self-pay

## 2022-01-04 NOTE — Telephone Encounter (Signed)
  Follow up Call-     01/03/2022    1:39 PM  Call back number  Post procedure Call Back phone  # [phone]  Permission to leave phone message Yes     Patient questions:  Do you have a fever, pain , or abdominal swelling? No. Pain Score  0 *  Have you tolerated food without any problems? Yes.    Have you been able to return to your normal activities? Yes.    Do you have any questions about your discharge instructions: Diet   No. Medications  No. Follow up visit  No.  Do you have questions or concerns about your Care? No.  Actions: * If pain score is 4 or above: No action needed, pain <4.

## 2022-01-09 ENCOUNTER — Encounter: Payer: Self-pay | Admitting: Gastroenterology
